# Patient Record
Sex: Male | Born: 1955 | Race: Black or African American | Hispanic: No | Marital: Married | Smoking: Never smoker
Health system: Southern US, Community
[De-identification: ages and names within clinical notes are randomized; demographics above are authoritative.]

---

## 2009-09-09 ENCOUNTER — Encounter: Admission: RE | Admit: 2009-09-09 | Discharge: 2009-09-09 | Payer: Self-pay | Admitting: Internal Medicine

## 2013-09-08 ENCOUNTER — Emergency Department (HOSPITAL_BASED_OUTPATIENT_CLINIC_OR_DEPARTMENT_OTHER): Payer: No Typology Code available for payment source

## 2013-09-08 ENCOUNTER — Encounter (HOSPITAL_BASED_OUTPATIENT_CLINIC_OR_DEPARTMENT_OTHER): Payer: Self-pay | Admitting: *Deleted

## 2013-09-08 ENCOUNTER — Emergency Department (HOSPITAL_BASED_OUTPATIENT_CLINIC_OR_DEPARTMENT_OTHER)
Admission: EM | Admit: 2013-09-08 | Discharge: 2013-09-08 | Disposition: A | Discharge status: Home / Self Care | Payer: No Typology Code available for payment source | Attending: Emergency Medicine | Admitting: Emergency Medicine

## 2013-09-08 DIAGNOSIS — M65839 Other synovitis and tenosynovitis, unspecified forearm: Secondary | ICD-10-CM | POA: Insufficient documentation

## 2013-09-08 DIAGNOSIS — G5600 Carpal tunnel syndrome, unspecified upper limb: Secondary | ICD-10-CM | POA: Insufficient documentation

## 2013-09-08 DIAGNOSIS — G5601 Carpal tunnel syndrome, right upper limb: Secondary | ICD-10-CM

## 2013-09-08 DIAGNOSIS — M778 Other enthesopathies, not elsewhere classified: Secondary | ICD-10-CM

## 2013-09-08 MED ORDER — IBUPROFEN 800 MG PO TABS: 800.0000 mg | ORAL_TABLET | Freq: Three times a day (TID) | ORAL | Status: AC | PRN

## 2013-09-08 MED ORDER — IBUPROFEN 800 MG PO TABS
800.0000 mg | ORAL_TABLET | Freq: Once | ORAL | Status: AC
  Administered 2013-09-08: 800 mg via ORAL
  Filled 2013-09-08: qty 1

## 2013-09-08 NOTE — ED Provider Notes (Signed)
CSN: [id number]     Arrival date & time 09/08/13  1659 History  This chart was scribed for Ryan Hait, MD by Joaquin Music, ED Scribe. This patient was seen in room MH06/MH06 and the patient's care was started at [facility] PM     Chief Complaint  Patient presents with  . Hand Pain    Patient is a 57 y.o. male presenting with hand pain. The history is provided by the patient. No language interpreter was used.  Hand Pain This is a recurrent problem. The current episode started more than 2 days ago. The problem occurs constantly. The problem has been gradually worsening. Pertinent negatives include no chest pain. The symptoms are aggravated by bending and twisting. Nothing relieves the symptoms. He has tried nothing for the symptoms.   HPI Comments: Ryan Charles is a 57 y.o. male who presents to the Emergency Department complaining of constant, worsened right wrist pain and swelling that began a few days ago. Pt denies any traumatic injury that may have caused this pain. He reports having some radiation up the right arm but states it has resolved. Pt works with a Facilities manager. He states he received a shot in the left hand previously for potentially carpel tunnel. Pt denies fevers or other associated symptoms.  He states swelling has improved in that hand from yesterday.  History reviewed. No pertinent past medical history. History reviewed. No pertinent past surgical history. No family history on file. History  Substance Use Topics  . Smoking status: Never Smoker   . Smokeless tobacco: Never Used  . Alcohol Use: No    Review of Systems  Constitutional: Negative for fever.  Cardiovascular: Negative for chest pain.  Musculoskeletal: Negative for back pain.  All other systems reviewed and are negative.    Allergies  Review of patient's allergies indicates no known allergies.  Home Medications  No current outpatient prescriptions on file. BP 119/74  Pulse 65   Temp(Src) 98.8 F (37.1 C) (Oral)  Resp 20  Ht 5\' 7"  (1.702 m)  Wt 150 lb (68.04 kg)  BMI 23.49 kg/m2  SpO2 100% Physical Exam  Nursing note and vitals reviewed. Constitutional: He is oriented to person, place, and time. He appears well-developed and well-nourished. No distress.  HENT:  Head: Normocephalic and atraumatic.  Eyes: EOM are normal.  Neck: Neck supple. No tracheal deviation present.  Cardiovascular: Normal rate, regular rhythm and normal heart sounds.   Pulmonary/Chest: Effort normal. No respiratory distress.  Musculoskeletal:       Right wrist: He exhibits decreased range of motion and tenderness. He exhibits no bony tenderness, no swelling, no effusion, no deformity and no laceration.       Right hand: He exhibits tenderness (base of hand on dorsal side) and swelling (mild, diffuse). He exhibits normal range of motion, no bony tenderness, normal capillary refill, no deformity and no laceration. Normal sensation noted. Decreased strength (secondary to wrist pain) noted.  Neurological: He is alert and oriented to person, place, and time.  Skin: Skin is warm and dry.  Psychiatric: He has a normal mood and affect. His behavior is normal.    ED Course  Procedures  DIAGNOSTIC STUDIES: Oxygen Saturation is 100% on RA, normal by my interpretation.    COORDINATION OF CARE: 6:23 PM-Discussed treatment plan which includes pain medication, will apply splint, and recommend to Orthopedic with pt at bedside and pt agreed to plan.   Labs Review Labs Reviewed - No data to display Imaging  Review Dg Hand Complete Right  09/08/2013   CLINICAL DATA:  Hand pain for 1 week, no known injury  EXAM: RIGHT HAND - COMPLETE 3+ VIEW  COMPARISON:  None.  FINDINGS: There is no evidence of fracture or dislocation. Mild degenerative changes distal interphalangeal joint 5th finger. No radiopaque foreign body. Soft tissues are unremarkable. Mild narrowing of radiocarpal joint space. Mild degenerative  changes proximal interphalangeal joint 2nd finger.  IMPRESSION: No acute fracture or subluxation. Mild degenerative changes.   Electronically Signed   By: Natasha Mead   On: 09/08/2013 17:44    MDM   1. Tendonitis of wrist, right   2. Carpal tunnel syndrome of right wrist    57 year old male presents with right wrist pain. Patient is right handed and for work he uses is all all day right-handed. He noticed wrist pain over the past few days. He has decreased range of motion of his wrist without any effusion or redness or swelling. I am not concern for a septic R wrist. He has no known trauma. X-ray is normal. He has mild swelling of the left hand with some occasional tingling and pain radiating up his arm. On exam, his pain is mildly swollen with good cap refill normal range of motion in all muscle actions are normal. His decreased range of motion with flexion extension of his wrist. He cannot perform Phalen and Tinel's due to pain. He has pain when you The carpal tunnel area. I'm concerned for wrist tendinitis versus carpal, syndrome. I will give NSAIDs and a Velcro wrist splint. I will give him Hand Surgery f/u.  I personally performed the services described in this documentation, which was scribed in my presence. The recorded information has been reviewed and is accurate.      Ryan Hait, MD 09/08/13 [phone]

## 2013-09-08 NOTE — ED Notes (Signed)
Right hand pain x 1 week denies known injury- swelling noted

## 2015-02-19 ENCOUNTER — Encounter (HOSPITAL_BASED_OUTPATIENT_CLINIC_OR_DEPARTMENT_OTHER): Payer: Self-pay | Admitting: *Deleted

## 2015-02-19 ENCOUNTER — Emergency Department (HOSPITAL_BASED_OUTPATIENT_CLINIC_OR_DEPARTMENT_OTHER): Payer: BLUE CROSS/BLUE SHIELD

## 2015-02-19 ENCOUNTER — Emergency Department (HOSPITAL_BASED_OUTPATIENT_CLINIC_OR_DEPARTMENT_OTHER)
Admission: EM | Admit: 2015-02-19 | Discharge: 2015-02-19 | Disposition: A | Discharge status: Home / Self Care | Payer: BLUE CROSS/BLUE SHIELD | Attending: Emergency Medicine | Admitting: Emergency Medicine

## 2015-02-19 DIAGNOSIS — M109 Gout, unspecified: Secondary | ICD-10-CM | POA: Diagnosis not present

## 2015-02-19 DIAGNOSIS — M25472 Effusion, left ankle: Secondary | ICD-10-CM | POA: Insufficient documentation

## 2015-02-19 DIAGNOSIS — R2242 Localized swelling, mass and lump, left lower limb: Secondary | ICD-10-CM | POA: Diagnosis present

## 2015-02-19 MED ORDER — HYDROCODONE-ACETAMINOPHEN 5-325 MG PO TABS: 1.0000 | ORAL_TABLET | ORAL | Status: AC | PRN

## 2015-02-19 MED ORDER — INDOMETHACIN 25 MG PO CAPS: 25.0000 mg | ORAL_CAPSULE | Freq: Three times a day (TID) | ORAL | Status: AC | PRN

## 2015-02-19 NOTE — Discharge Instructions (Signed)
Take indomethacin as prescribed. Take Vicodin for severe pain only. No driving or operating heavy machinery while taking vicodin. This medication may cause drowsiness. Elevate your ankle and apply ice. Follow up with your primary care doctor.  Gout Gout is an inflammatory arthritis caused by a buildup of uric acid crystals in the joints. Uric acid is a chemical that is normally present in the blood. When the level of uric acid in the blood is too high it can form crystals that deposit in your joints and tissues. This causes joint redness, soreness, and swelling (inflammation). Repeat attacks are common. Over time, uric acid crystals can form into masses (tophi) near a joint, destroying bone and causing disfigurement. Gout is treatable and often preventable. CAUSES  The disease begins with elevated levels of uric acid in the blood. Uric acid is produced by your body when it breaks down a naturally found substance called purines. Certain foods you eat, such as meats and fish, contain high amounts of purines. Causes of an elevated uric acid level include:  Being passed down from parent to child (heredity).  Diseases that cause increased uric acid production (such as obesity, psoriasis, and certain cancers).  Excessive alcohol use.  Diet, especially diets rich in meat and seafood.  Medicines, including certain cancer-fighting medicines (chemotherapy), water pills (diuretics), and aspirin.  Chronic kidney disease. The kidneys are no longer able to remove uric acid well.  Problems with metabolism. Conditions strongly associated with gout include:  Obesity.  High blood pressure.  High cholesterol.  Diabetes. Not everyone with elevated uric acid levels gets gout. It is not understood why some people get gout and others do not. Surgery, joint injury, and eating too much of certain foods are some of the factors that can lead to gout attacks. SYMPTOMS   An attack of gout comes on quickly. It  causes intense pain with redness, swelling, and warmth in a joint.  Fever can occur.  Often, only one joint is involved. Certain joints are more commonly involved:  Base of the big toe.  Knee.  Ankle.  Wrist.  Finger. Without treatment, an attack usually goes away in a few days to weeks. Between attacks, you usually will not have symptoms, which is different from many other forms of arthritis. DIAGNOSIS  Your caregiver will suspect gout based on your symptoms and exam. In some cases, tests may be recommended. The tests may include:  Blood tests.  Urine tests.  X-rays.  Joint fluid exam. This exam requires a needle to remove fluid from the joint (arthrocentesis). Using a microscope, gout is confirmed when uric acid crystals are seen in the joint fluid. TREATMENT  There are two phases to gout treatment: treating the sudden onset (acute) attack and preventing attacks (prophylaxis).  Treatment of an Acute Attack.  Medicines are used. These include anti-inflammatory medicines or steroid medicines.  An injection of steroid medicine into the affected joint is sometimes necessary.  The painful joint is rested. Movement can worsen the arthritis.  You may use warm or cold treatments on painful joints, depending which works best for you.  Treatment to Prevent Attacks.  If you suffer from frequent gout attacks, your caregiver may advise preventive medicine. These medicines are started after the acute attack subsides. These medicines either help your kidneys eliminate uric acid from your body or decrease your uric acid production. You may need to stay on these medicines for a very long time.  The early phase of treatment with preventive medicine  can be associated with an increase in acute gout attacks. For this reason, during the first few months of treatment, your caregiver may also advise you to take medicines usually used for acute gout treatment. Be sure you understand your  caregiver's directions. Your caregiver may make several adjustments to your medicine dose before these medicines are effective.  Discuss dietary treatment with your caregiver or dietitian. Alcohol and drinks high in sugar and fructose and foods such as meat, poultry, and seafood can increase uric acid levels. Your caregiver or dietitian can advise you on drinks and foods that should be limited. HOME CARE INSTRUCTIONS   Do not take aspirin to relieve pain. This raises uric acid levels.  Only take over-the-counter or prescription medicines for pain, discomfort, or fever as directed by your caregiver.  Rest the joint as much as possible. When in bed, keep sheets and blankets off painful areas.  Keep the affected joint raised (elevated).  Apply warm or cold treatments to painful joints. Use of warm or cold treatments depends on which works best for you.  Use crutches if the painful joint is in your leg.  Drink enough fluids to keep your urine clear or pale yellow. This helps your body get rid of uric acid. Limit alcohol, sugary drinks, and fructose drinks.  Follow your dietary instructions. Pay careful attention to the amount of protein you eat. Your daily diet should emphasize fruits, vegetables, whole grains, and fat-free or low-fat milk products. Discuss the use of coffee, vitamin C, and cherries with your caregiver or dietitian. These may be helpful in lowering uric acid levels.  Maintain a healthy body weight. SEEK MEDICAL CARE IF:   You develop diarrhea, vomiting, or any side effects from medicines.  You do not feel better in 24 hours, or you are getting worse. SEEK IMMEDIATE MEDICAL CARE IF:   Your joint becomes suddenly more tender, and you have chills or a fever. MAKE SURE YOU:   Understand these instructions.  Will watch your condition.  Will get help right away if you are not doing well or get worse. Document Released: 12/09/2000 Document Revised: 04/28/2014 Document  Reviewed: 07/25/2012 [facility] Patient Information 2015 [place], [place]. This information is not intended to replace advice given to you by your health care provider. Make sure you discuss any questions you have with your health care provider. Ankle Pain Ankle pain is a common symptom. The bones, cartilage, tendons, and muscles of the ankle joint perform a lot of work each day. The ankle joint holds your body weight and allows you to move around. Ankle pain can occur on either side or back of 1 or both ankles. Ankle pain may be sharp and burning or dull and aching. There may be tenderness, stiffness, redness, or warmth around the ankle. The pain occurs more often when a person walks or puts pressure on the ankle. CAUSES  There are many reasons ankle pain can develop. It is important to work with your caregiver to identify the cause since many conditions can impact the bones, cartilage, muscles, and tendons. Causes for ankle pain include:  Injury, including a break (fracture), sprain, or strain often due to a fall, sports, or a high-impact activity.  Swelling (inflammation) of a tendon (tendonitis).  Achilles tendon rupture.  Ankle instability after repeated sprains and strains.  Poor foot alignment.  Pressure on a nerve (tarsal tunnel syndrome).  Arthritis in the ankle or the lining of the ankle.  Crystal formation in the ankle (gout  or pseudogout). DIAGNOSIS  A diagnosis is based on your medical history, your symptoms, results of your physical exam, and results of diagnostic tests. Diagnostic tests may include X-ray exams or a computerized magnetic scan (magnetic resonance imaging, MRI). TREATMENT  Treatment will depend on the cause of your ankle pain and may include:  Keeping pressure off the ankle and limiting activities.  Using crutches or other walking support (a cane or brace).  Using rest, ice, compression, and elevation.  Participating in physical therapy or home  exercises.  Wearing shoe inserts or special shoes.  Losing weight.  Taking medications to reduce pain or swelling or receiving an injection.  Undergoing surgery. HOME CARE INSTRUCTIONS   Only take over-the-counter or prescription medicines for pain, discomfort, or fever as directed by your caregiver.  Put ice on the injured area.  Put ice in a plastic bag.  Place a towel between your skin and the bag.  Leave the ice on for 15-20 minutes at a time, 03-04 times a day.  Keep your leg raised (elevated) when possible to lessen swelling.  Avoid activities that cause ankle pain.  Follow specific exercises as directed by your caregiver.  Record how often you have ankle pain, the location of the pain, and what it feels like. This information may be helpful to you and your caregiver.  Ask your caregiver about returning to work or sports and whether you should drive.  Follow up with your caregiver for further examination, therapy, or testing as directed. SEEK MEDICAL CARE IF:   Pain or swelling continues or worsens beyond 1 week.  You have an oral temperature above 102 F (38.9 C).  You are feeling unwell or have chills.  You are having an increasingly difficult time with walking.  You have loss of sensation or other new symptoms.  You have questions or concerns. MAKE SURE YOU:   Understand these instructions.  Will watch your condition.  Will get help right away if you are not doing well or get worse. Document Released: 06/01/2010 Document Revised: 03/05/2012 Document Reviewed: 06/01/2010 [facility] Patient Information 2015 [place], [place]. This information is not intended to replace advice given to you by your health care provider. Make sure you discuss any questions you have with your health care provider.

## 2015-02-19 NOTE — ED Provider Notes (Signed)
CSN: [id number]     Arrival date & time 02/19/15  1834 History   First MD Initiated Contact with Patient 02/19/15 1920     Chief Complaint  Patient presents with  . Joint Swelling     (Consider location/radiation/quality/duration/timing/severity/associated sxs/prior Treatment) HPI Comments: 59 year old male presenting with left ankle pain and swelling 2 days. No known injury or trauma. He reports he was walking when the pain began. Pain worse with pressure. States this feels similar to gout flares in the past. He tried taking meloxicam with minimal relief. Denies fever or chills. States he had some fish this past week. Denies alcohol use.  The history is provided by the patient.    History reviewed. No pertinent past medical history. History reviewed. No pertinent past surgical history. No family history on file. History  Substance Use Topics  . Smoking status: Never Smoker   . Smokeless tobacco: Never Used  . Alcohol Use: No    Review of Systems  Musculoskeletal:       + L ankle pain and swelling.  All other systems reviewed and are negative.     Allergies  Review of patient's allergies indicates no known allergies.  Home Medications   Prior to Admission medications   Medication Sig Start Date End Date Taking? Authorizing Provider  HYDROcodone-acetaminophen (NORCO/VICODIN) 5-325 MG per tablet Take 1-2 tablets by mouth every 4 (four) hours as needed. 02/19/15   Kathrynn Speed, PA-C  ibuprofen (ADVIL,MOTRIN) 800 MG tablet Take 1 tablet (800 mg total) by mouth every 8 (eight) hours as needed for pain. 09/08/13   Elwin Mocha, MD  indomethacin (INDOCIN) 25 MG capsule Take 1 capsule (25 mg total) by mouth 3 (three) times daily as needed. 02/19/15   Baby Gieger M Lannah Koike, PA-C   BP 132/69 mmHg  Pulse 62  Temp(Src) 98 F (36.7 C) (Oral)  Resp 16  Ht  (1.702 m)  Wt 155 lb (70.308 kg)  BMI 24.27 kg/m2  SpO2 100% Physical Exam  Constitutional: He is oriented to person, place, and  time. He appears well-developed and well-nourished. No distress.  HENT:  Head: Normocephalic and atraumatic.  Eyes: Conjunctivae and EOM are normal.  Neck: Normal range of motion. Neck supple.  Cardiovascular: Normal rate, regular rhythm and normal heart sounds.   +2 PT/DP pulse on left.  Pulmonary/Chest: Effort normal and breath sounds normal.  Musculoskeletal:  L ankle TTP laterally with swelling. FROM, pain with movement. Mild warmth. No cellulitis.  Neurological: He is alert and oriented to person, place, and time.  Skin: Skin is warm and dry.  Psychiatric: He has a normal mood and affect. His behavior is normal.  Nursing note and vitals reviewed.   ED Course  Procedures (including critical care time) Labs Review Labs Reviewed - No data to display  Imaging Review Dg Ankle Complete Left  02/19/2015   CLINICAL DATA:  Left ankle pain and swelling, no known injury, initial encounter  EXAM: LEFT ANKLE COMPLETE - 3+ VIEW  COMPARISON:  None.  FINDINGS: There is no evidence of fracture, dislocation, or joint effusion. There is no evidence of arthropathy or other focal bone abnormality. Mild soft tissue swelling is noted laterally  IMPRESSION: Mild soft tissue swelling.  No acute bony abnormality is seen.   Electronically Signed   By: Alcide Clever M.D.   On: 02/19/2015 19:32     EKG Interpretation None      MDM   Final diagnoses:  Left ankle swelling  Acute  gout of left ankle, unspecified cause   NAD. Afebrile. Neurovascularly intact. Most likely acute gout flare. X-ray obtained in triage prior to patient being seen, no acute findings other than mild soft tissue swelling. Will treat with indomethacin and pain medication. Doubt joint infection. Follow-up with PCP. Stable for discharge. Return precautions given. Patient states understanding of treatment care plan and is agreeable.  Kathrynn SpeedRobyn M Krystine Pabst, PA-C 02/19/15 2003  Ethelda ChickMartha K Linker, MD 02/19/15 2006

## 2015-02-19 NOTE — ED Notes (Signed)
Pt given rx x 2 for indocin and norco

## 2015-02-19 NOTE — ED Notes (Addendum)
Swelling in his left ankle since yesterday. Denies injury.

## 2020-10-01 ENCOUNTER — Encounter (HOSPITAL_BASED_OUTPATIENT_CLINIC_OR_DEPARTMENT_OTHER): Payer: Self-pay | Admitting: *Deleted

## 2020-10-01 ENCOUNTER — Other Ambulatory Visit: Payer: Self-pay

## 2020-10-01 ENCOUNTER — Emergency Department (HOSPITAL_BASED_OUTPATIENT_CLINIC_OR_DEPARTMENT_OTHER): Payer: 59

## 2020-10-01 ENCOUNTER — Emergency Department (HOSPITAL_BASED_OUTPATIENT_CLINIC_OR_DEPARTMENT_OTHER)
Admission: EM | Admit: 2020-10-01 | Discharge: 2020-10-01 | Disposition: A | Discharge status: Home / Self Care | Payer: 59 | Attending: Emergency Medicine | Admitting: Emergency Medicine

## 2020-10-01 DIAGNOSIS — M5459 Other low back pain: Secondary | ICD-10-CM | POA: Insufficient documentation

## 2020-10-01 DIAGNOSIS — Y92410 Unspecified street and highway as the place of occurrence of the external cause: Secondary | ICD-10-CM | POA: Insufficient documentation

## 2020-10-01 DIAGNOSIS — Y93I9 Activity, other involving external motion: Secondary | ICD-10-CM | POA: Insufficient documentation

## 2020-10-01 DIAGNOSIS — M545 Low back pain, unspecified: Secondary | ICD-10-CM

## 2020-10-01 MED ORDER — METHOCARBAMOL 500 MG PO TABS: 500.0000 mg | ORAL_TABLET | Freq: Two times a day (BID) | ORAL | 0 refills | Status: AC
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
## 2020-10-01 NOTE — ED Notes (Signed)
AVS reviewed with client, pt teaching done in regards to ways to aid in pain control, also reviewed Rx of Robaxin with pt as well. Opportunity for questions provided, copy of AVS provided as well
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
## 2020-10-01 NOTE — Discharge Instructions (Signed)
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
